# Patient Record
Sex: Male | Born: 1965 | Race: White | Hispanic: No | Marital: Married | Smoking: Never smoker
Health system: Southern US, Community
[De-identification: ages and names within clinical notes are randomized; demographics above are authoritative.]

## PROBLEM LIST (undated history)

## (undated) HISTORY — PX: KIDNEY STONE SURGERY: SHX686

## (undated) HISTORY — PX: KNEE SURGERY: SHX244

---

## 2008-06-15 ENCOUNTER — Emergency Department (HOSPITAL_BASED_OUTPATIENT_CLINIC_OR_DEPARTMENT_OTHER): Admission: EM | Admit: 2008-06-15 | Discharge: 2008-06-15 | Payer: Self-pay | Admitting: Emergency Medicine

## 2008-07-02 ENCOUNTER — Encounter: Admission: RE | Admit: 2008-07-02 | Discharge: 2008-07-02 | Payer: Self-pay | Admitting: Urology

## 2008-07-15 ENCOUNTER — Ambulatory Visit (HOSPITAL_BASED_OUTPATIENT_CLINIC_OR_DEPARTMENT_OTHER): Admission: RE | Admit: 2008-07-15 | Discharge: 2008-07-15 | Payer: Self-pay | Admitting: Urology

## 2010-07-02 IMAGING — CR DG ABDOMEN ACUTE W/ 1V CHEST
4 series · 4 of 4 positions shown · non-contrast
Comparison: None.

CLINICAL DATA: 41-year-old male left upper quadrant pain for 3
hours with nausea.

ACUTE ABDOMEN SERIES (ABDOMEN 2 VIEW & CHEST 1 VIEW)

[w chest pa]
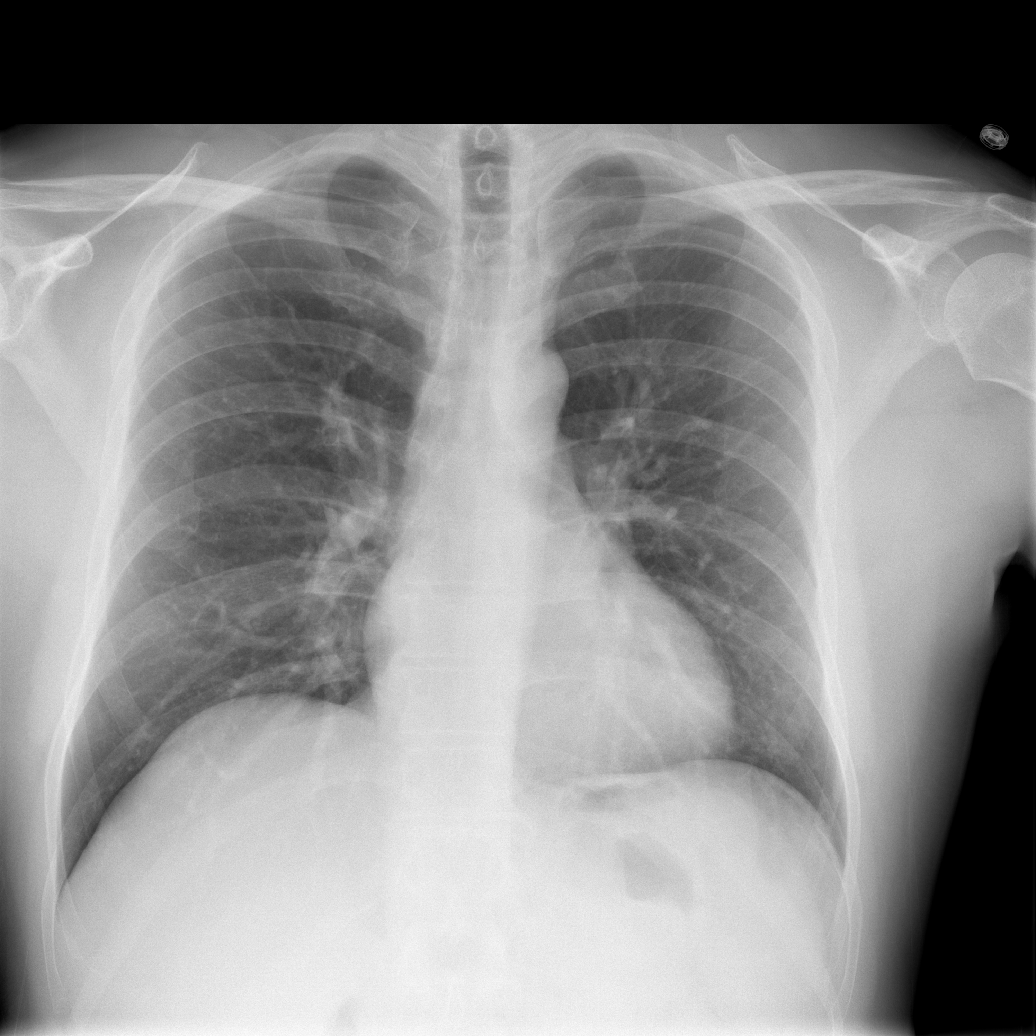

[w abdomen upright]
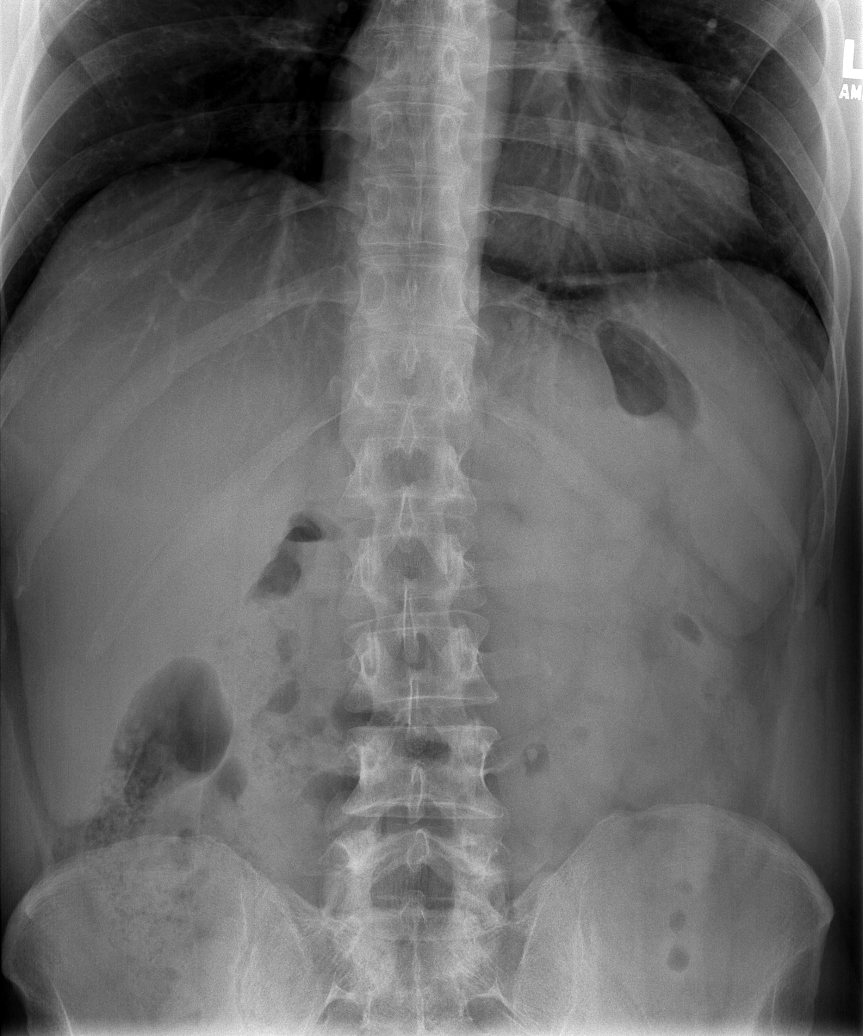

[t abdomen supine (1 of 2)]
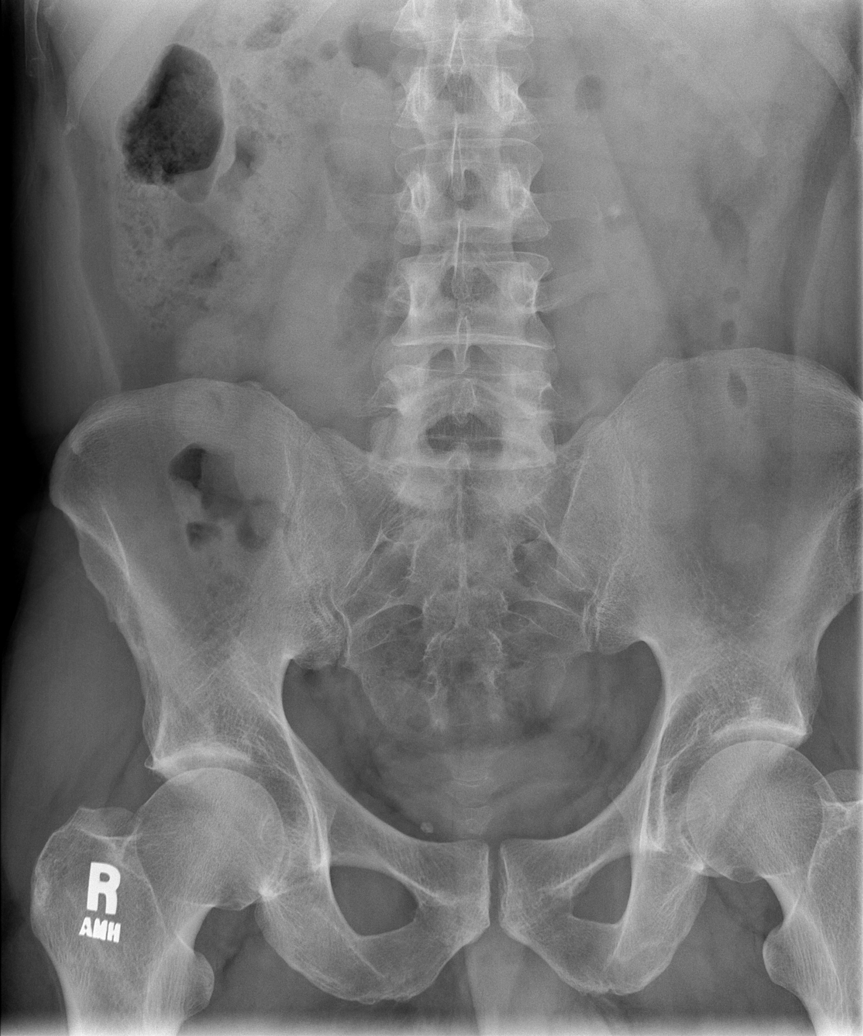

[t abdomen supine (2 of 2)]
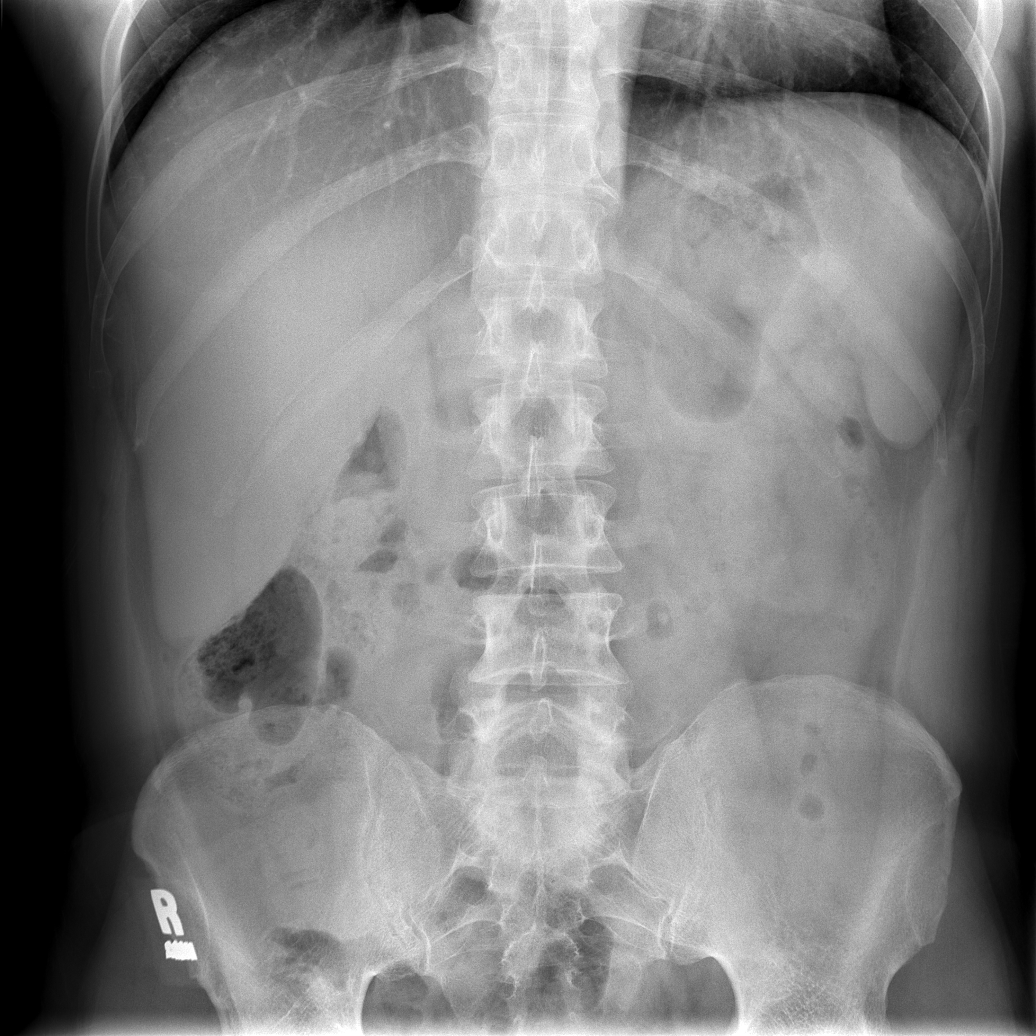

[4 of 4 positions shown; findings below may reference images not displayed]

FINDINGS: Upright frontal view the chest.  Normal cardiac size and
mediastinal contours.  Lung volumes are within normal limits.  No
pneumothorax or pneumoperitoneum.  No pleural effusion or confluent
airspace opacity.

Nonobstructed bowel gas pattern. Visceral contours are within
normal limits.  Gas is seen and non distended stomach.  There is a
6 mm calcific density which projects to the left of the L3
transverse process suspicious for a left ureteral calculus.
Alternatively, this could reflect enteric contents.  Probable
phlebolith in the deep right hemi pelvis. No acute osseous
abnormality identified.
IMPRESSION: 1. Nonobstructed bowel gas pattern, no free air.
2.  Possible 6 mm left ureteral calculus.  Correlate for clinical
signs/symptoms of obstructive uropathy and noncontrast enhanced CT
would evaluate further as indicated.
3. No acute cardiopulmonary abnormality.

## 2011-02-02 NOTE — Op Note (Signed)
NAME:  Randy Goodwin, Randy Goodwin NO.:  [id number]   MEDICAL RECORD NO.:  [id number]          PATIENT TYPE:  AMB   LOCATION:  [facility]                         FACILITY:  [facility]   PHYSICIAN:  Valetta Fuller, M.D.  DATE OF BIRTH:  02-25-1966   DATE OF PROCEDURE:  07/15/2008  DATE OF DISCHARGE:                               OPERATIVE REPORT   PREOPERATIVE DIAGNOSIS:  Left distal ureteral calculus, 7 mm.   POSTOPERATIVE DIAGNOSIS:  Left distal ureteral calculus, 7 mm.   PROCEDURE PERFORMED:  Cystoscopy, left retrograde pyelogram, rigid  ureteroscopy, flexible ureteroscopy, holmium laser lithotripsy with  basketing of stone fragments and double-J stent placement.   SURGEON:  Dr. Isabel Caprice.   ANESTHESIA:  General.   INDICATIONS:  The patient is a 45 year old male.  He originally  presented to our [place] clinic with a diagnosis of a 7-mm proximal  ureteral calculus on the left.  The patient's initial presentation was  in late September of this year.  He was seen and assessed by Dr.  Archer Asa.  He was given opportunity to try to pass the stone.  He has  had follow-up visit with myself as well as Dr. Archer Asa.  Clinically,  he has had progression of the stone from the proximal ureter to the  distal ureter.  He has been relatively asymptomatic for the last 2-3  weeks but has now had the acute stone for well over a month.  The  patient is planning on a vacation in the next couple weeks and wanted  definitive management.  KUB was obtained today which confirmed continued  presence of the stone in the distal left ureter.  He appeared to  understand the advantages and disadvantages of ureteroscopy for  management of this problem.  Full informed consent has been obtained.   TECHNIQUE AND FINDINGS:  The patient was brought to the operating room  where he had successful induction of general anesthesia.  He was placed  in lithotomy position and prepped and draped in usual manner.  Cystoscopy revealed no abnormal findings within the prostatic urethra,  anterior urethra or bladder.  Retrograde pyelogram was done with an 8-  [place] cone-tip catheter.  Fluoroscopic interpretation was performed by  myself.  A filling defect was noted in the distal left ureter  approximately 2 cm above the ureterovesical junction.  A guidewire was  able to be passed alongside the stone and up to the renal pelvis without  difficulty.   Initial attempts to engage the distal ureter were unsuccessful due to  some narrowing and inability to safely proceed.  For that reason, the  inside portion of a one-step dilating sheath was used to perform distal  ureteral dilation.  We were then easily able to engage the distal  ureter.  Two cm above the ureteral vesicle junction, a 7-mm stone which  was partially impacted was encountered.  Holmium laser lithotriptor was  used to break up the stone into approximately half a dozen pieces.  The  largest fragment was 4-5 mm.  The largest fragment migrated proximally,  and as we made attempts to  basket extract it, it actually migrated to  the renal pelvis.  For that reason, the rigid ureteroscope was removed.  An access sheath was placed, and the flexible ureteroscope was inserted.  The stone was encountered in the renal pelvis and basket extracted  without difficulty.  Some additional 1- to 2-mm fragments were also  basket  extracted.  The guidewire was confirmed to be in good position.  We  placed a 6-French 24 cm double-J stent without difficulty.  The patient  appeared to tolerate the procedure well, and there were no obvious  complications or difficulties.  He was brought to recovery room in  stable condition.      Valetta Fuller, M.D.  Electronically Signed     DSG/MEDQ  D:  07/15/2008  T:  07/15/2008  Job:  [id number]

## 2011-06-21 LAB — CBC
MCHC: 35
MCV: 85
Platelets: 178
RBC: 5.2
RDW: 12.3

## 2011-06-21 LAB — COMPREHENSIVE METABOLIC PANEL
ALT: 58 — ABNORMAL HIGH
AST: 38 — ABNORMAL HIGH
Alkaline Phosphatase: 79
BUN: 15
CO2: 25
Calcium: 9.4
Chloride: 105
GFR calc Af Amer: >60
GFR calc non Af Amer: >60
Glucose, Bld: 108 — ABNORMAL HIGH
Sodium: 142

## 2011-06-21 LAB — URINE MICROSCOPIC-ADD ON

## 2011-06-21 LAB — URINALYSIS, ROUTINE W REFLEX MICROSCOPIC
Leukocytes, UA: NEGATIVE
Nitrite: NEGATIVE
Specific Gravity, Urine: 1.025
Urobilinogen, UA: 0.2

## 2011-06-21 LAB — DIFFERENTIAL
Eosinophils Relative: 2
Monocytes Absolute: 0.4
Monocytes Relative: 5

## 2011-06-21 LAB — LIPASE, BLOOD: Lipase: 161

## 2011-06-22 LAB — POCT HEMOGLOBIN-HEMACUE: Hemoglobin: 15.5

## 2016-09-16 ENCOUNTER — Encounter: Payer: Self-pay | Admitting: Emergency Medicine

## 2016-09-16 ENCOUNTER — Emergency Department (INDEPENDENT_AMBULATORY_CARE_PROVIDER_SITE_OTHER)
Admission: EM | Admit: 2016-09-16 | Discharge: 2016-09-16 | Disposition: A | Discharge status: Home / Self Care | Payer: Federal, State, Local not specified - PPO | Source: Home / Self Care | Attending: Emergency Medicine | Admitting: Emergency Medicine

## 2016-09-16 DIAGNOSIS — N23 Unspecified renal colic: Secondary | ICD-10-CM

## 2016-09-16 DIAGNOSIS — R310 Gross hematuria: Secondary | ICD-10-CM

## 2016-09-16 LAB — POCT URINALYSIS DIP (MANUAL ENTRY)
BILIRUBIN UA: NEGATIVE
Glucose, UA: NEGATIVE
LEUKOCYTES UA: NEGATIVE
Nitrite, UA: NEGATIVE
PH UA: 5.5 (ref 5–8)
SPEC GRAV UA: 1.015 (ref 1.005–1.03)
Urobilinogen, UA: NEGATIVE (ref 0–1)

## 2016-09-16 MED ORDER — HYDROCODONE-ACETAMINOPHEN 5-325 MG PO TABS: 1.0000 | ORAL_TABLET | ORAL | 0 refills | Status: AC | PRN

## 2016-09-16 MED ORDER — CIPROFLOXACIN HCL 500 MG PO TABS: 500.0000 mg | ORAL_TABLET | Freq: Two times a day (BID) | ORAL | 0 refills | Status: AC

## 2016-09-16 MED ORDER — TAMSULOSIN HCL 0.4 MG PO CAPS: 0.4000 mg | ORAL_CAPSULE | Freq: Two times a day (BID) | ORAL | 0 refills | Status: AC

## 2016-09-16 NOTE — ED Provider Notes (Signed)
[facility]    CSN: [id number] Arrival date & time: 09/16/16  1606     History   Chief Complaint Chief Complaint  Patient presents with  . Hematuria    HPI Randy Goodwin is a 50 y.o. male.   The history is provided by the patient.  Hematuria  Episode onset: 2 weeks ago. Episode frequency: 2 weeks ago, hematuria that lasted 4 days, then went away, then hematuria started again 4 days ago and has persisted. The problem has not changed since onset.Pertinent negatives include no chest pain, no abdominal pain, no headaches and no shortness of breath. Nothing aggravates the symptoms. He has tried water for the symptoms. The treatment provided no relief.  Had hematuria 2 weeks ago associated with mild right flank pain, but the pain resolved as did the hematuria. Then, 5 days ago, had mild right flank pain lasting 10 minutes which resolved and has not had any flank or abdominal pain since. However hematuria has persisted the past 5 days. Associated symptoms He is not sure if he's had significant urinary frequency. He denies dysuria or pyuria. Denies fever or chills or nausea or vomiting. No chest pain or shortness of breath or ENT symptoms. Denies weight loss. He otherwise feels well.  He states that she might have a mild UTI and requests antibiotic as first line treatment.  Past medical history pertinent for diagnosis of left sided kidney stones 8 years ago, with successful stent surgery. [place] Then, left kidney stones 4 years ago, successful lithotripsy[facility] [place] [place] [facility] [facility]. Denies any urologic problems ever since then, and has not followed up with urologist since Dr. Edwin Capuckett 4 years ago.   History reviewed. No pertinent past medical history.  There are no active problems to display for this patient.   Past Surgical History:  Procedure Laterality Date  . KIDNEY STONE SURGERY    . KNEE SURGERY         Home Medications    Prior to  Admission medications   Medication Sig Start Date End Date Taking? Authorizing Provider  ciprofloxacin (CIPRO) 500 MG tablet Take 1 tablet (500 mg total) by mouth 2 (two) times daily. For 7 days 09/16/16   Lajean Manesavid Massey, MD  HYDROcodone-acetaminophen (NORCO/VICODIN) 5-325 MG tablet Take 1-2 tablets by mouth every 4 (four) hours as needed for severe pain. Take with food. 09/16/16   Lajean Manesavid Massey, MD  tamsulosin (FLOMAX) 0.4 MG CAPS capsule Take 1 capsule (0.4 mg total) by mouth 2 (two) times daily after a meal. To help pass kidney stone 09/16/16 09/21/16  Lajean Manesavid Massey, MD    Family History History reviewed. No pertinent family history.  Social History Social History  Substance Use Topics  . Smoking status: Never Smoker  . Smokeless tobacco: Never Used  . Alcohol use Yes     Allergies   Patient has no allergy information on record.   Review of Systems Review of Systems  Respiratory: Negative for shortness of breath.   Cardiovascular: Negative for chest pain.  Gastrointestinal: Negative for abdominal pain.  Genitourinary: Positive for hematuria.  Neurological: Negative for headaches.  All other systems reviewed and are negative.    Physical Exam Triage Vital Signs ED Triage Vitals  Enc Vitals Group     BP 09/16/16 1653 148/92     Pulse Rate 09/16/16 1653 66     Resp --      Temp 09/16/16 1653 97.6 F (36.4 C)  Temp Source 09/16/16 1653 Oral     SpO2 09/16/16 1653 96 %     Weight 09/16/16 1653 148 lb (67.1 kg)     Height --      Head Circumference --      Peak Flow --      Pain Score 09/16/16 1655 0     Pain Loc --      Pain Edu? --      Excl. in GC? --    No data found.   Updated Vital Signs BP 148/92 (BP Location: Right Arm)   Pulse 66   Temp 97.6 F (36.4 C) (Oral)   Wt 148 lb (67.1 kg)   SpO2 96%   Visual Acuity Right Eye Distance:   Left Eye Distance:   Bilateral Distance:    Right Eye Near:   Left Eye Near:    Bilateral Near:     Physical  Exam  Constitutional: He is oriented to person, place, and time. He appears well-developed and well-nourished. No distress.  HENT:  Head: Normocephalic and atraumatic.  Mouth/Throat: Oropharynx is clear and moist.  Eyes: Pupils are equal, round, and reactive to light. No scleral icterus.  Neck: Normal range of motion. Neck supple. No JVD present.  Cardiovascular: Normal rate and regular rhythm.   Pulmonary/Chest: Effort normal and breath sounds normal.  Abdominal: Soft. Bowel sounds are normal. He exhibits no distension and no mass. There is no tenderness. There is no rebound and no guarding.  No CVA tenderness or flank tenderness  Genitourinary:  Genitourinary Comments: He declined GU or prostate exam at this time  Musculoskeletal: Normal range of motion.  Neurological: He is alert and oriented to person, place, and time. No cranial nerve deficit.  Skin: Skin is warm and dry. Capillary refill takes less than 2 seconds. No rash noted.  Psychiatric: He has a normal mood and affect. His behavior is normal.  Vitals reviewed.    UC Treatments / Results  Labs (all labs ordered are listed, but only abnormal results are displayed) Labs Reviewed  POCT URINALYSIS DIP (MANUAL ENTRY) - Abnormal; Notable for the following:       Result Value   Color, UA red (*)    Clarity, UA cloudy (*)    Bilirubin, UA small (*)    Blood, UA large (*)    Protein Ur, POC trace (*)    All other components within normal limits  URINE CULTURE    EKG  EKG Interpretation None       Radiology No results found.  Procedures Procedures (including critical care time)  Medications Ordered in UC Medications - No data to display   Initial Impression / Assessment and Plan / UC Course  I have reviewed the triage vital signs and the nursing notes.  Pertinent labs & imaging results that were available during my care of the patient were reviewed by me and considered in my medical decision making (see chart  for details).  Clinical Course     UA is cloudy with large RBCs, see report above. Reviewed this with patient. Explained that clinically, I'm most suspicious of right sided kidney stone or ureteral stone as the cause for his hematuria, although his pain resolved 4 days ago. I explained its possible could be a UTI or other urologic causes.  Treatment options discussed, as well as risks, benefits, alternatives. Patient voiced understanding and agreement with the following plans: Urine strainer provided, strain all urine Drink plenty of fluids. He  declined any imaging, although I advised a CT of abdomen and pelvis for further workup. Send off urine culture. Cipro prescribed for antibiotic. Flomax to help pass any possible stone. Hydrocodone/acetaminophen, small amount, #12 prescribed in case he were to have any severe acute renal colic pain.  I urged him to follow up with urologist within the next 5-7 days, or emergency room if any severe or worsening symptoms or red flags. Precautions discussed. Red flags discussed. Questions invited and answered. Patient voiced understanding and agreement.   Final Clinical Impressions(s) / UC Diagnoses   Final diagnoses:  Gross hematuria  Renal colic on right side    New Prescriptions New Prescriptions   CIPROFLOXACIN (CIPRO) 500 MG TABLET    Take 1 tablet (500 mg total) by mouth 2 (two) times daily. For 7 days   HYDROCODONE-ACETAMINOPHEN (NORCO/VICODIN) 5-325 MG TABLET    Take 1-2 tablets by mouth every 4 (four) hours as needed for severe pain. Take with food.   TAMSULOSIN (FLOMAX) 0.4 MG CAPS CAPSULE    Take 1 capsule (0.4 mg total) by mouth 2 (two) times daily after a meal. To help pass kidney stone     Lajean Manesavid Massey, MD 09/17/16 1311

## 2016-09-16 NOTE — ED Triage Notes (Signed)
Pt c/o hematuria x4 days. He has hx of kidney stones. Denies pain at this time.

## 2016-09-18 ENCOUNTER — Telehealth: Payer: Self-pay | Admitting: Emergency Medicine

## 2016-09-18 LAB — URINE CULTURE: ORGANISM ID, BACTERIA: NO GROWTH
